# Patient Record
Sex: Male | Born: 1985 | Race: Black or African American | Hispanic: No | Marital: Single | Smoking: Current some day smoker
Health system: Southern US, Community
[De-identification: ages and names within clinical notes are randomized; demographics above are authoritative.]

---

## 2006-11-09 ENCOUNTER — Emergency Department (HOSPITAL_COMMUNITY): Admission: EM | Admit: 2006-11-09 | Discharge: 2006-11-09 | Payer: Self-pay | Admitting: Family Medicine

## 2015-06-06 ENCOUNTER — Encounter (HOSPITAL_COMMUNITY): Payer: Self-pay

## 2015-06-06 ENCOUNTER — Emergency Department (HOSPITAL_COMMUNITY)
Admission: EM | Admit: 2015-06-06 | Discharge: 2015-06-06 | Disposition: A | Discharge status: Home / Self Care | Payer: Non-veteran care | Attending: Emergency Medicine | Admitting: Emergency Medicine

## 2015-06-06 ENCOUNTER — Emergency Department (HOSPITAL_COMMUNITY): Payer: Non-veteran care

## 2015-06-06 DIAGNOSIS — R0789 Other chest pain: Secondary | ICD-10-CM | POA: Insufficient documentation

## 2015-06-06 DIAGNOSIS — F17210 Nicotine dependence, cigarettes, uncomplicated: Secondary | ICD-10-CM | POA: Diagnosis not present

## 2015-06-06 DIAGNOSIS — R079 Chest pain, unspecified: Secondary | ICD-10-CM | POA: Diagnosis present

## 2015-06-06 LAB — BASIC METABOLIC PANEL
Anion gap: 9 (ref 5–15)
CO2: 25 mmol/L (ref 22–32)
CREATININE: 0.89 mg/dL (ref 0.61–1.24)
Calcium: 8.9 mg/dL (ref 8.9–10.3)
Chloride: 104 mmol/L (ref 101–111)
GLUCOSE: 113 mg/dL — AB (ref 65–99)
POTASSIUM: 4 mmol/L (ref 3.5–5.1)
SODIUM: 138 mmol/L (ref 135–145)

## 2015-06-06 LAB — CBC
HEMATOCRIT: 43.8 % (ref 39.0–52.0)
Hemoglobin: 14.4 g/dL (ref 13.0–17.0)
MCH: 30.3 pg (ref 26.0–34.0)
MCHC: 32.9 g/dL (ref 30.0–36.0)
MCV: 92.2 fL (ref 78.0–100.0)
Platelets: 224 10*3/uL (ref 150–400)
RBC: 4.75 MIL/uL (ref 4.22–5.81)
RDW: 14.6 % (ref 11.5–15.5)
WBC: 7.3 10*3/uL (ref 4.0–10.5)

## 2015-06-06 LAB — I-STAT TROPONIN, ED: Troponin i, poc: 0 ng/mL (ref 0.00–0.08)

## 2015-06-06 MED ORDER — IBUPROFEN 600 MG PO TABS: 600.0000 mg | ORAL_TABLET | Freq: Four times a day (QID) | ORAL | Status: AC | PRN

## 2015-06-06 NOTE — ED Notes (Signed)
Pt reports onset last night constant sharp right sided chest pain, made worse with taking deep breaths.  No nausea, diaphoresis, cold/cough, injury or increased exercise.  Pt states feels short of breath at times.  NAD at triage.

## 2015-06-06 NOTE — ED Provider Notes (Signed)
CSN: [id number]     Arrival date & time 06/06/15  1333 History   First MD Initiated Contact with Patient 06/06/15 1638     Chief Complaint  Patient presents with  . Chest Pain   Patient is a 30 y.o. male presenting with chest pain. The history is provided by the patient. No language interpreter was used.  Chest Pain Pain location:  R chest Pain quality: aching and sharp   Pain radiates to:  Does not radiate Pain radiates to the back: no   Pain severity:  Moderate Onset quality:  Gradual Duration:  1 day Timing:  Intermittent Progression:  Unchanged Chronicity:  New Context: breathing   Relieved by:  None tried Worsened by:  Nothing tried Ineffective treatments:  None tried Associated symptoms: no abdominal pain, no altered mental status, no back pain, no cough, no dizziness, no dysphagia, no fever, no headache, no lower extremity edema, no nausea, no numbness, no palpitations, no shortness of breath, not vomiting and no weakness   Risk factors: male sex   Risk factors: no coronary artery disease, no diabetes mellitus, no hypertension, no prior DVT/PE and no smoking     History reviewed. No pertinent past medical history. History reviewed. No pertinent past surgical history. History reviewed. No pertinent family history. Social History  Substance Use Topics  . Smoking status: Current Some Day Smoker    Types: Cigarettes  . Smokeless tobacco: None  . Alcohol Use: 1.2 oz/week    2 Cans of beer per week    Review of Systems  Constitutional: Negative for fever, chills, activity change and appetite change.  HENT: Negative for congestion, dental problem, ear pain, facial swelling, hearing loss, rhinorrhea, sneezing, sore throat, trouble swallowing and voice change.   Eyes: Negative for photophobia, pain, redness and visual disturbance.  Respiratory: Negative for apnea, cough, chest tightness, shortness of breath, wheezing and stridor.   Cardiovascular: Positive for chest pain.  Negative for palpitations and leg swelling.  Gastrointestinal: Negative for nausea, vomiting, abdominal pain, diarrhea, constipation, blood in stool and abdominal distention.  Endocrine: Negative for polydipsia and polyuria.  Genitourinary: Negative for frequency, hematuria, flank pain, decreased urine volume and difficulty urinating.  Musculoskeletal: Negative for back pain, joint swelling, gait problem, neck pain and neck stiffness.  Skin: Negative for rash and wound.  Allergic/Immunologic: Negative for immunocompromised state.  Neurological: Negative for dizziness, syncope, facial asymmetry, speech difficulty, weakness, light-headedness, numbness and headaches.  Hematological: Negative for adenopathy.  Psychiatric/Behavioral: Negative for suicidal ideas, behavioral problems, confusion, sleep disturbance and agitation. The patient is not nervous/anxious.   All other systems reviewed and are negative.     Allergies  Review of patient's allergies indicates no known allergies.  Home Medications   Prior to Admission medications   Medication Sig Start Date End Date Taking? Authorizing Provider  ranitidine (ZANTAC) 150 MG tablet Take 150 mg by mouth daily as needed (acid reflux).   Yes Historical Provider, MD  ibuprofen (ADVIL,MOTRIN) 600 MG tablet Take 1 tablet (600 mg total) by mouth every 6 (six) hours as needed. 06/06/15   Dan Humphreys, MD   BP 120/67 mmHg  Pulse 60  Temp(Src) 98.7 F (37.1 C) (Oral)  Resp 18  Wt 112.946 kg  SpO2 99% Physical Exam  Constitutional: He is oriented to person, place, and time. He appears well-developed and well-nourished. No distress.  HENT:  Head: Normocephalic and atraumatic.  Right Ear: External ear normal.  Left Ear: External ear normal.  Eyes: Pupils are  equal, round, and reactive to light. Right eye exhibits no discharge. Left eye exhibits no discharge.  Neck: Normal range of motion. No JVD present. No tracheal deviation present.   Cardiovascular: Normal rate, regular rhythm and normal heart sounds.  Exam reveals no friction rub.   No murmur heard. Pulmonary/Chest: Effort normal and breath sounds normal. No stridor. No respiratory distress. He has no wheezes.  Abdominal: Soft. Bowel sounds are normal. He exhibits no distension. There is no rebound and no guarding.  Musculoskeletal: Normal range of motion. He exhibits no edema or tenderness.  Lymphadenopathy:    He has no cervical adenopathy.  Neurological: He is alert and oriented to person, place, and time. No cranial nerve deficit. Coordination normal.  Skin: Skin is warm and dry. No rash noted. No pallor.  Psychiatric: He has a normal mood and affect. His behavior is normal. Judgment and thought content normal.  Nursing note and vitals reviewed.   ED Course  Procedures (including critical care time) Labs Review Labs Reviewed  BASIC METABOLIC PANEL - Abnormal; Notable for the following:    Glucose, Bld 113 (*)    BUN <5 (*)    All other components within normal limits  CBC  I-STAT TROPOININ, ED    Imaging Review Dg Chest 2 View  06/06/2015  CLINICAL DATA:  Stabbing right-sided chest pain began yesterday and continues today worse with deep inspiration EXAM: CHEST  2 VIEW COMPARISON:  None. FINDINGS: The heart size and mediastinal contours are within normal limits. Both lungs are clear. The visualized skeletal structures are unremarkable. IMPRESSION: No active cardiopulmonary disease. Electronically Signed   By: Esperanza Heiraymond  Rubner M.D.   On: 06/06/2015 14:08   I have personally reviewed and evaluated these images and lab results as part of my medical decision-making.   EKG Interpretation None      MDM   Final diagnoses:  Musculoskeletal chest pain    Patient with 1 day of right-sided chest pain, pleuritic in nature. Patient denies prior history of this pain. Do not take any medications at home prior to arrival.  Vital signs normal. EKG with no ischemic  changes. Chest x-ray with no acute findings. Troponin negative, BMP, CBC unremarkable.  Patient with a normal physical exam. No friction rub on exam. No rash noted the body.  Patient PERC negative, do not suspect PE. Aspect ACS. Pain likely muscular skeletal in nature versus costochondritis.  Patient encouraged to use ibuprofen or Tylenol at home for symptomatically relief and use heating pads at home.  Patient ambulatory in no acute distress at time of discharge. [facility] with primary care physician  I discussed case with my attending, Dr. Judd Lienelo.      Dan HumphreysMichael Chantilly Linskey, MD 06/06/15 1723  Geoffery Lyonsouglas Delo, MD 06/06/15 [phone]

## 2015-06-06 NOTE — Discharge Instructions (Signed)

## 2017-04-14 IMAGING — DX DG CHEST 2V
2 series · 2 of 2 positions shown · non-contrast
Comparison: None.

CLINICAL DATA: Stabbing right-sided chest pain began yesterday and
continues today worse with deep inspiration

EXAM:
CHEST  2 VIEW

[chest pa]
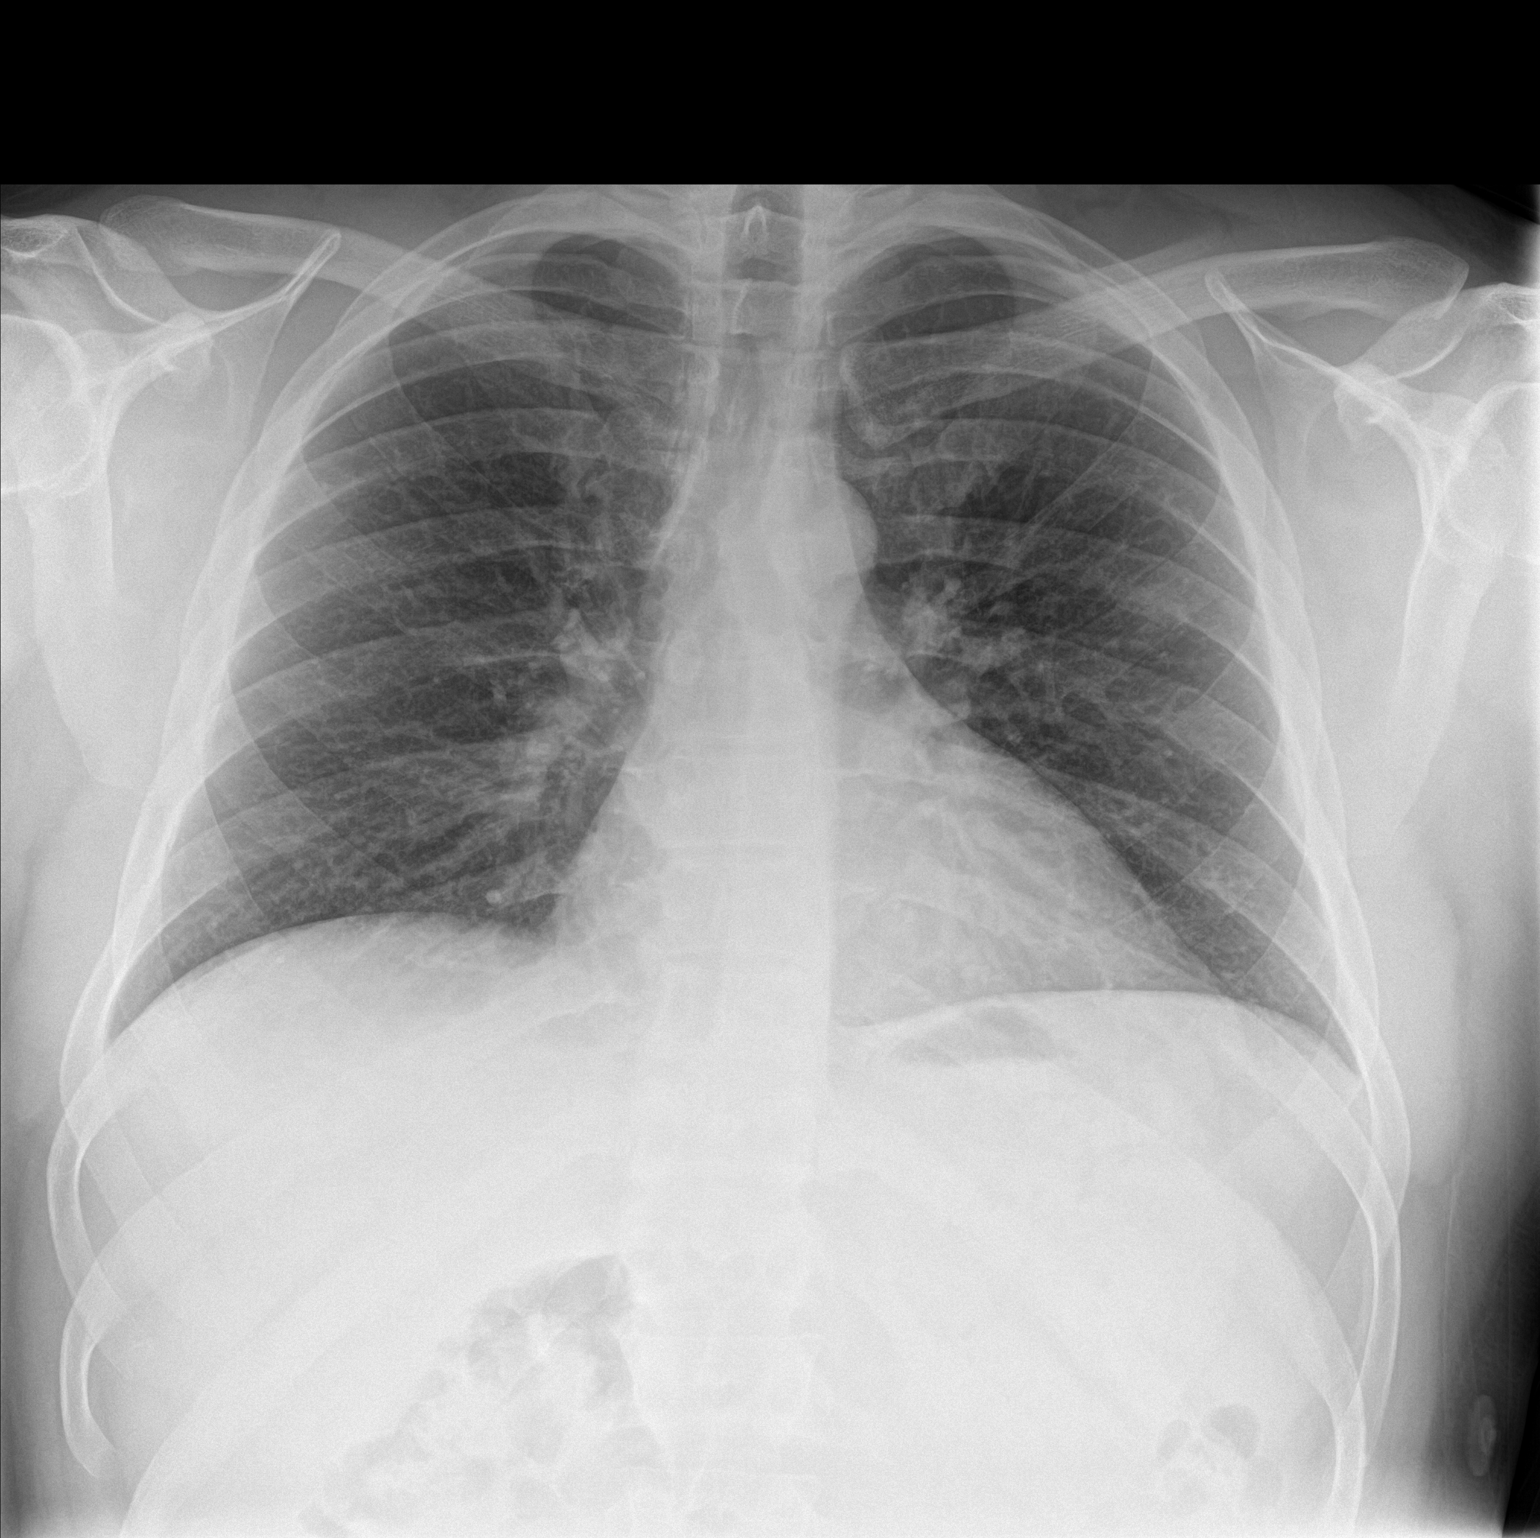

[chest lat]
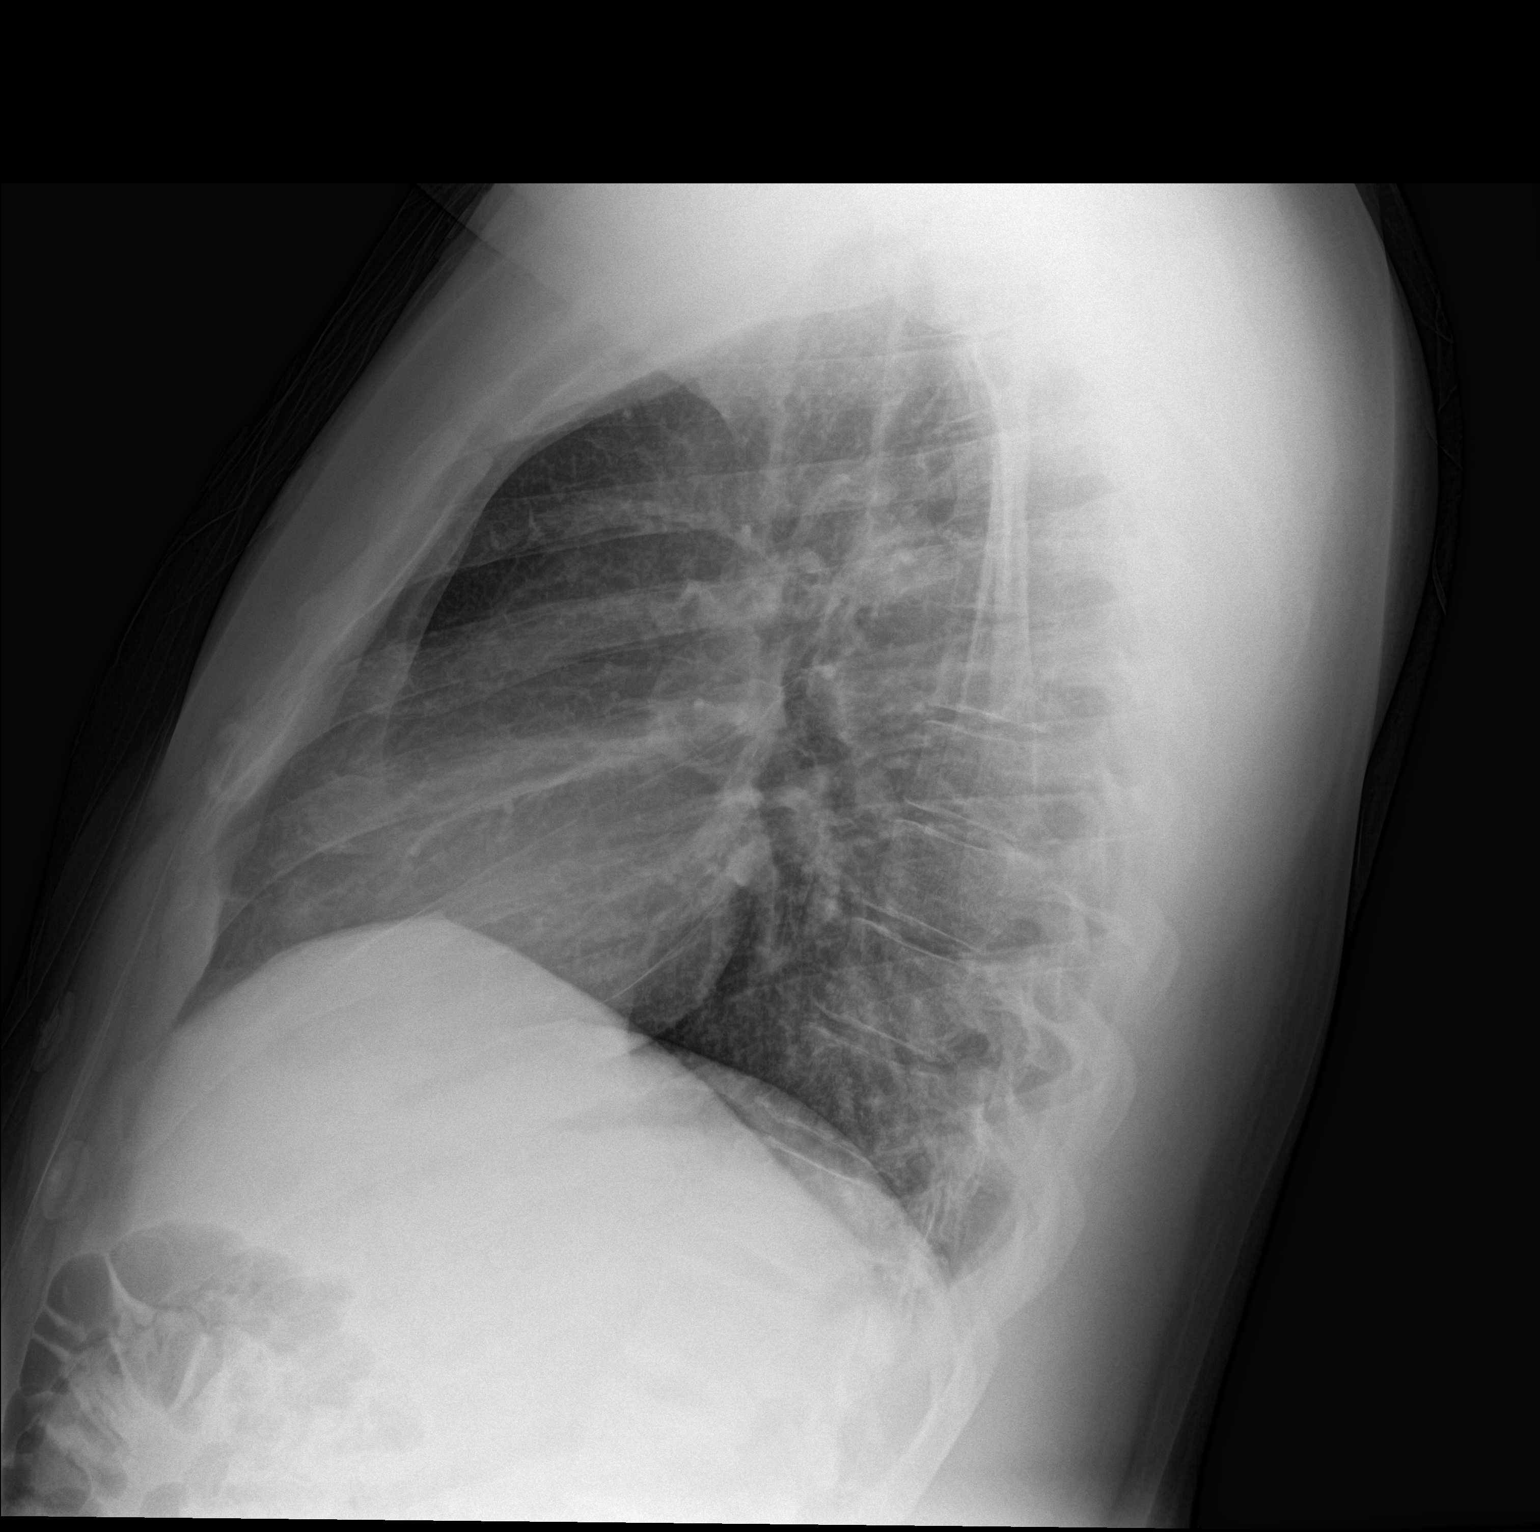

[2 of 2 positions shown; findings below may reference images not displayed]

FINDINGS: The heart size and mediastinal contours are within normal limits.
Both lungs are clear. The visualized skeletal structures are
unremarkable.
IMPRESSION: No active cardiopulmonary disease.

## 2017-11-17 ENCOUNTER — Emergency Department (HOSPITAL_COMMUNITY)
Admission: EM | Admit: 2017-11-17 | Discharge: 2017-11-17 | Disposition: A | Discharge status: Home / Self Care | Payer: Non-veteran care | Attending: Emergency Medicine | Admitting: Emergency Medicine

## 2017-11-17 ENCOUNTER — Encounter (HOSPITAL_COMMUNITY): Payer: Self-pay

## 2017-11-17 ENCOUNTER — Other Ambulatory Visit: Payer: Self-pay

## 2017-11-17 DIAGNOSIS — R239 Unspecified skin changes: Secondary | ICD-10-CM

## 2017-11-17 DIAGNOSIS — R222 Localized swelling, mass and lump, trunk: Secondary | ICD-10-CM | POA: Insufficient documentation

## 2017-11-17 DIAGNOSIS — M79671 Pain in right foot: Secondary | ICD-10-CM

## 2017-11-17 DIAGNOSIS — F17210 Nicotine dependence, cigarettes, uncomplicated: Secondary | ICD-10-CM | POA: Diagnosis not present

## 2017-11-17 DIAGNOSIS — M79672 Pain in left foot: Secondary | ICD-10-CM | POA: Insufficient documentation

## 2017-11-17 NOTE — ED Provider Notes (Signed)
[facility]-EMERGENCY DEPT Provider Note   CSN: [id number] Arrival date & time: 11/17/17  1307     History   Chief Complaint Chief Complaint  Patient presents with  . Abscess    HPI Harry Schaefer is a 32 y.o. male with no significant past medical history presents emergency department today for several complaints.  Patient reports that he recently got out of the [employer].  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  He reports that since applying a pair of [employer]'s his pain has been relieved.  He denies any trauma to the area.  No lower extremity swelling, open wounds, calf pain, fever, decreased range of motion, deformity, numbness/tingling/weakness.  He reports this is a chronic pain that is been ongoing for several years.  He has not been taking anything for it.  He believes this is due to "heel spurs".  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  He denies any overlying erythema, heat, fever, nausea/vomiting.  He notes these have been here for several years but has never been evaluated.  He denies any injury, trauma or foreign body introduction. He has not taken anything for it. Nothing makes them better or worse. They have not changed in size. There is no pain related to this. No recent weight loss. No numbness/tingling/weakness of extremities, difficulty swallowing or visual/hearing changes.   HPI  History reviewed. No pertinent past medical history.  There are no active problems to display for this patient.   History reviewed. No pertinent surgical history.      Home Medications    Prior to Admission medications   Medication Sig Start Date End Date Taking? Authorizing Provider  ibuprofen (ADVIL,MOTRIN) 600 MG tablet Take 1 tablet (600 mg total) by mouth every 6 (six) hours as needed. 06/06/15   Dan HumphreysIrick, Tilford Deaton, MD    ranitidine (ZANTAC) 150 MG tablet Take 150 mg by mouth daily as needed (acid reflux).    [provider]    Family History History reviewed. No pertinent family history.  Social History Social History   Tobacco Use  . Smoking status: Current Some Day Smoker    Types: Cigarettes  Substance Use Topics  . Alcohol use: Yes    Alcohol/week: 2.0 standard drinks    Types: 2 Cans of beer per week  . Drug use: No     Allergies   Patient has no known allergies.   Review of Systems Review of Systems  All other systems reviewed and are negative.    Physical Exam Updated Vital Signs BP 123/84 (BP Location: Right Arm)   Pulse 99   Temp 98.5 F (36.9 C) (Oral)   Resp 18   Ht 6\' 2"  (1.88 m)   Wt 131.5 kg   SpO2 96%   BMI 37.23 kg/m   Physical Exam  Constitutional: He appears well-developed and well-nourished.  HENT:  Head: Normocephalic and atraumatic.  Right Ear: External ear normal.  Left Ear: External ear normal.  Nose: Nose normal.  Mouth/Throat: Uvula is midline, oropharynx is clear and moist and mucous membranes are normal. No tonsillar exudate.  Eyes: Pupils are equal, round, and reactive to light. Right eye exhibits no discharge. Left eye exhibits no discharge. No scleral icterus.  Neck: Trachea normal. Neck supple. No spinous process tenderness present. No neck rigidity. Normal range  of motion present.    No nuchal rigidity or meningismus  Cardiovascular: Normal rate, regular rhythm and intact distal pulses.  No murmur heard. Pulses:      Radial pulses are 2+ on the right side, and 2+ on the left side.       Dorsalis pedis pulses are 2+ on the right side, and 2+ on the left side.       Posterior tibial pulses are 2+ on the right side, and 2+ on the left side.  No lower extremity swelling or edema. Calves symmetric in size bilaterally.  Pulmonary/Chest: Effort normal and breath sounds normal. He exhibits no tenderness.  Abdominal: Soft. Bowel sounds  are normal. There is no tenderness. There is no rebound and no guarding.  Musculoskeletal: He exhibits no edema.       Right ankle: Achilles tendon exhibits pain.       Left ankle: Achilles tendon exhibits pain.       Back:       Right lower leg: Normal.       Left lower leg: Normal.       Right foot: Normal.       Left foot: Normal.  Lower extremities with normal range of motion passively.  No calf tenderness.  Calves are equal in size bilaterally.  No edema.  Compartments are soft.  He is neurovascularly intact to the lower extremities.  No bony tenderness palpation.  There is pain at the base of the Achilles tendon bilaterally.  Normal Thompson test bilaterally.  No defect of the Achilles bilaterally.  No skin changes noted.  Lymphadenopathy:    He has no cervical adenopathy.  Neurological: He is alert.  Speech clear. Follows commands. No facial droop. PERRLA. EOM grossly intact. CN III-XII grossly intact. Grossly moves all extremities 4 without ataxia. Able and appropriate strength for age to upper and lower extremities bilaterally  Skin: Skin is warm and dry. Capillary refill takes less than 2 seconds. No rash noted. He is not diaphoretic. No erythema.  Psychiatric: He has a normal mood and affect.  Nursing note and vitals reviewed.   ED Treatments / Results  Labs (all labs ordered are listed, but only abnormal results are displayed) Labs Reviewed - No data to display  EKG None  Radiology No results found.  Procedures Procedures (including critical care time) EMERGENCY DEPARTMENT US SOFT TISSUE INTERPRETATION "Study: Limited Soft Tissue Ultrasound"  INDICATIONS: Nodule under skin Multiple views of the body part were obtained in real-time with a multi-frequency linear probe  PERFORMED BY: Myself IMAGES ARCHIVED?: No SIDE:Left and Midline BODY PART:Chest wall and Lower back INTERPRETATION:  No abcess noted, No cellulitis noted and Normal soft tissue  ultrasound    Medications Ordered in ED Medications - No data to display   Initial Impression / Assessment and Plan / ED Course  I have reviewed the triage vital signs and the nursing notes.  Pertinent labs & imaging results that were available during my care of the patient were reviewed by me and considered in my medical decision making (see chart for details).     32 y.o. male presenting with multiple complaints.   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  He notes this is chronic.  It is improved since buying a pair of [employer]'s.  I have a low suspicion for DVT based on exam and reported symptoms.  Patient with tenderness at the base  of his Achilles bilaterally.  There is no evidence of tendon rupture.  There is no calf tenderness.  He is neurovascular intact and compartments are soft.  He was offered an x-ray.  He denies at this time.  Will have him follow-up with podiatry. Conservative therapies recommended.  Patient with a noted lipoma of the right neck.  There is no evidence of abscess, rash, overlying infection.  This does not appear to be causing nerve impingement and he denies any neurologic symptoms.  Will have follow-up with [place] surgery as needed.  Handouts given.  Patient with nodules under the skin that has been there for several years not changed in size.  He denies any systemic symptoms with this.  No pain related to this.  Is noted to be a pea-sized nodule underneath the skin that is mobile.  There is no overlying skin changes.  No evidence of cellulitis or abscess.  Bedside ultrasound done with patient and noted no abnormalities.  No indication for emergent CT.  Will give information for patient to establish care with a PCP.  Recommended he may need to follow with a dermatologist.  The evaluation does not show pathology that would require ongoing emergent intervention or inpatient treatment. I  advised the patient to follow-up with PCP, dermatology, podiatry and [facility] this week. I advised the patient to return to the emergency department with new or worsening symptoms or new concerns. Specific return precautions discussed. The patient verbalized understanding and agreement with plan. All questions answered. No further questions at this time. The patient is hemodynamically stable, mentating appropriately and appears safe for discharge.  Final Clinical Impressions(s) / ED Diagnoses   Final diagnoses:  Skin change  Pain of both heels    ED Discharge Orders    None       Princella Pellegrini 11/17/17 1544    Wynetta Fines, MD 11/18/17 [phone]

## 2017-11-17 NOTE — Discharge Instructions (Signed)
Please call and make appropriate follow-up. Is very important that you establish care with a primary care provider to help manage these issues. I referred you to a PCP office that takes people even without insurance.  Please otherwise use the phone number listed in the back of this packet to find a primary care provider. Follow-up with podiatry for heel pain.  You were offered an x-ray today. Take Tylenol and Ibuprofen as needed for pain.  Follow up with dermatology in regards to your skin changes. See list below to call and find a dermatologist in the area.  If you develop worsening or new concerning symptoms you can return to the emergency department for re-evaluation.   [facility]:   [facility]  3.2 [phone])  Dermatologist  [address] [place]  [phone]   Dr. Mertha FindersJohn H. Hall Jr, MD  2.6 [phone])  Dermatologist  [address]  [phone]  [facility]  3.5 (3)  [facility]  [address]  [phone]   [facility]  4.0 (4)  Dermatologist  [address]  [phone]  Janalyn Harderafeen Stuart MD  3.0 (2)  Dermatologist  [address]  [phone]  Hoyle SauerMccoy Bruce  2.7 (6)  Dermatologist  [address]  [phone]  [place] Amy Y MD  2.0 (1)  Dermatologist  [address]  [phone]  [facility]  5.0 (3)  Doctor  [address]  [phone]

## 2017-11-17 NOTE — ED Triage Notes (Signed)
Pt reports lumps on neck, head, under arm, and back. Lumps noted but no drainage from any sites. Pt reports "heel spurs are acting up".

## 2021-12-27 ENCOUNTER — Emergency Department (HOSPITAL_COMMUNITY): Payer: No Typology Code available for payment source

## 2021-12-27 ENCOUNTER — Encounter (HOSPITAL_COMMUNITY): Payer: Self-pay

## 2021-12-27 ENCOUNTER — Emergency Department (HOSPITAL_COMMUNITY)
Admission: EM | Admit: 2021-12-27 | Discharge: 2021-12-27 | Disposition: A | Discharge status: Home / Self Care | Payer: No Typology Code available for payment source | Attending: Emergency Medicine | Admitting: Emergency Medicine

## 2021-12-27 DIAGNOSIS — M5441 Lumbago with sciatica, right side: Secondary | ICD-10-CM

## 2021-12-27 DIAGNOSIS — M545 Low back pain, unspecified: Secondary | ICD-10-CM | POA: Diagnosis present

## 2021-12-27 MED ORDER — METHOCARBAMOL 500 MG PO TABS: 500.0000 mg | ORAL_TABLET | Freq: Two times a day (BID) | ORAL | 0 refills | Status: AC

## 2021-12-27 MED ORDER — PREDNISONE 20 MG PO TABS
60.0000 mg | ORAL_TABLET | Freq: Once | ORAL | Status: AC
  Administered 2021-12-27: 60 mg via ORAL
  Filled 2021-12-27: qty 3

## 2021-12-27 MED ORDER — LIDOCAINE 5 % EX PTCH: 1.0000 | MEDICATED_PATCH | CUTANEOUS | 0 refills | Status: AC

## 2021-12-27 MED ORDER — METHOCARBAMOL 500 MG PO TABS
500.0000 mg | ORAL_TABLET | Freq: Once | ORAL | Status: AC
  Administered 2021-12-27: 500 mg via ORAL
  Filled 2021-12-27: qty 1

## 2021-12-27 MED ORDER — OXYCODONE-ACETAMINOPHEN 5-325 MG PO TABS
1.0000 | ORAL_TABLET | Freq: Once | ORAL | Status: AC
  Administered 2021-12-27: 1 via ORAL
  Filled 2021-12-27: qty 1

## 2021-12-27 MED ORDER — OXYCODONE HCL 5 MG PO TABS: 5.0000 mg | ORAL_TABLET | ORAL | 0 refills | Status: AC | PRN

## 2021-12-27 MED ORDER — PREDNISONE 20 MG PO TABS: 20.0000 mg | ORAL_TABLET | Freq: Every day | ORAL | 0 refills | Status: AC

## 2021-12-27 NOTE — Discharge Instructions (Addendum)
Take the mediations as prescribed.  Follow up with Primary care provider if symptoms do not improve  Use caution when taking these medications as one is a narcotic medication does have the potential for addiction.  Do not drive or operate heavy machinery while taking this medication.

## 2021-12-27 NOTE — ED Provider Triage Note (Signed)
Emergency Medicine Provider Triage Evaluation Note  Nickalaus Crooke , a 36 y.o. male  was evaluated in triage.  Pt complains of right-sided low back pain that radiates to right buttocks.  No injury.  Denies saddle paresthesias, bowel/bladder incontinence, lower extremity numbness/tingling, lower extremity weakness.  No IV drug use.  No fever or chills.    Review of Systems  Positive: Back pain Negative: numbness  Physical Exam  BP 120/77 (BP Location: Left Arm)   Pulse 76   Temp 98 F (36.7 C) (Oral)   Resp 16   SpO2 98%  Gen:   Awake, no distress   Resp:  Normal effort  MSK:   Moves extremities without difficulty  Other:    Medical Decision Making  Medically screening exam initiated at 10:53 AM.  Appropriate orders placed.  Beren Yniguez was informed that the remainder of the evaluation will be completed by another provider, this initial triage assessment does not replace that evaluation, and the importance of remaining in the ED until their evaluation is complete.  Zenaida Deed, PA-C 12/27/21 1054

## 2021-12-27 NOTE — ED Triage Notes (Signed)
Pt arrived via POV, c/o right sided back pain, down into leg. Denies any trauma to area, believes it is sciatica.

## 2021-12-27 NOTE — ED Provider Notes (Signed)
[facility] DEPT Provider Note   CSN: [id number] Arrival date & time: 12/27/21  0944    History  Chief Complaint  Patient presents with   Back Pain    Harry Schaefer is a 36 y.o. male here for evaluation of low back pain, radiates into right leg. No recent falls or injuries. No numbness, weakness, leg swelling, redness, warmth. No fever, hx of IVDU, bowel or bladder incontinence saddle anesthesia. Ambulatory here. No CP, SOB, abd pain, flank pain, urinary symptoms. No meds PTA.  HPI     Home Medications Prior to Admission medications   Medication Sig Start Date End Date Taking? Authorizing Provider  lidocaine (LIDODERM) 5 % Place 1 patch onto the skin daily. Remove & Discard patch within 12 hours or as directed by MD 12/27/21  Yes Lakeyn Dokken A, PA-C  methocarbamol (ROBAXIN) 500 MG tablet Take 1 tablet (500 mg total) by mouth 2 (two) times daily. 12/27/21  Yes Lyzbeth Genrich A, PA-C  oxyCODONE (ROXICODONE) 5 MG immediate release tablet Take 1 tablet (5 mg total) by mouth every 4 (four) hours as needed for severe pain. 12/27/21  Yes Ariah Mower A, PA-C  predniSONE (DELTASONE) 20 MG tablet Take 1 tablet (20 mg total) by mouth daily for 4 days. 12/27/21 12/31/21 Yes Jaquise Faux A, PA-C  ibuprofen (ADVIL,MOTRIN) 600 MG tablet Take 1 tablet (600 mg total) by mouth every 6 (six) hours as needed. 06/06/15   Vira Blanco, MD  ranitidine (ZANTAC) 150 MG tablet Take 150 mg by mouth daily as needed (acid reflux).    [provider]      Allergies    Patient has no known allergies.    Review of Systems   Review of Systems  Constitutional: Negative.   HENT: Negative.  Negative for congestion.   Respiratory: Negative.    Cardiovascular: Negative.   Gastrointestinal: Negative.   Genitourinary: Negative.   Musculoskeletal:  Positive for back pain. Negative for arthralgias, gait problem, joint swelling, myalgias, neck pain and neck stiffness.   Skin: Negative.   Neurological: Negative.   All other systems reviewed and are negative.  Physical Exam Updated Vital Signs BP 120/77 (BP Location: Left Arm)   Pulse 76   Temp 98 F (36.7 C) (Oral)   Resp 16   SpO2 98%  Physical Exam Physical Exam  Constitutional: Pt appears well-developed and well-nourished. No distress.  HENT:  Head: Normocephalic and atraumatic.  Mouth/Throat: Oropharynx is clear and moist. No oropharyngeal exudate.  Eyes: Conjunctivae are normal.  Neck: Normal range of motion. Neck supple.  Full ROM without pain  Cardiovascular: Normal rate, regular rhythm and intact distal pulses.   Pulmonary/Chest: Effort normal and breath sounds normal. No respiratory distress. Pt has no wheezes.  Abdominal: Soft. Pt exhibits no distension. There is no tenderness, rebound or guarding. No abd bruit or pulsatile mass Musculoskeletal:  Full range of motion of the T-spine and L-spine with flexion, hyperextension, and lateral flexion. No midline tenderness or stepoffs. No tenderness to palpation of the spinous processes of the T-spine or L-spine. Mild tenderness to palpation of the paraspinous muscles of the L-spine. Positive straight leg raise. No shortening or rogation of legs. No tender femur, tib/fib, knee, feet BIL Lymphadenopathy:    Pt has no cervical adenopathy.  Neurological: Pt is alert. Pt has normal reflexes.  Speech is clear and goal oriented, follows commands Equal strength BIL Sensation normal to light and sharp touch Moves extremities without ataxia, coordination intact Normal  gait Normal balance Skin: Skin is warm and dry. No rash noted or lesions noted. Pt is not diaphoretic. No erythema, ecchymosis,edema or warmth.  Psychiatric: Pt has a normal mood and affect. Behavior is normal.  Nursing note and vitals reviewed.  ED Results / Procedures / Treatments   Labs (all labs ordered are listed, but only abnormal results are displayed) Labs Reviewed - No  data to display  EKG None  Radiology DG Lumbar Spine Complete  Result Date: 12/27/2021 CLINICAL DATA:  Pain across lower back.  No injury. EXAM: LUMBAR SPINE - COMPLETE 4+ VIEW COMPARISON:  None Available. FINDINGS: There is no evidence of lumbar spine fracture. Alignment is normal. Intervertebral disc spaces are maintained. Mild L5-S1 posterior facet arthropathy. IMPRESSION: 1. Mild L5-S1 posterior facet arthropathy. 2. Otherwise normal appearance of the lumbar spine. Electronically Signed   By: Ted Mcalpine M.D.   On: 12/27/2021 11:48    Procedures Procedures    Medications Ordered in ED Medications  predniSONE (DELTASONE) tablet 60 mg (60 mg Oral Given 12/27/21 1358)  methocarbamol (ROBAXIN) tablet 500 mg (500 mg Oral Given by Other 12/27/21 1358)  oxyCODONE-acetaminophen (PERCOCET/ROXICET) 5-325 MG per tablet 1 tablet (1 tablet Oral Given 12/27/21 1357)    ED Course/ Medical Decision Making/ A&P    36 year old here for evaluation of atraumatic back pain over the last few days.  Starts at his right lower back into his right glut into the lateral aspect of his right thigh.  Does not extend into his feet or toes.  No fever, history of IV drug use, bowel or bladder incontinence, saddle paresthesia.  No chest pain, abdominal pain, shortness of breath.  Low suspicion for dissection, VTE as cause of his leg pain.  No numbness or weakness.  He is ambulatory here.  Imaging personally viewed and interpreted X-ray lumbar withMild L5-S1 posterior facet arthropathy  Discussed results with patient, family in room.  Will treat symptomatically.  Urged follow outpatient with orthopedics, neurosurgery symptoms do not improve.  At this time of low suspicion for acute neurosurgical emergency such as cauda equina, discitis, osteomyelitis, transverse myelitis, psoas abscess, occult fracture.  Does have prediabetes.  I discussed first benefit of steroids.  We will keep close monitoring on his symptoms.   Return for new or worsening symptoms.  The patient has been appropriately medically screened and/or stabilized in the ED. I have low suspicion for any other emergent medical condition which would require further screening, evaluation or treatment in the ED or require inpatient management.  Patient is hemodynamically stable and in no acute distress.  Patient able to ambulate in department prior to ED.  Evaluation does not show acute pathology that would require ongoing or additional emergent interventions while in the emergency department or further inpatient treatment.  I have discussed the diagnosis with the patient and answered all questions.  Pain is been managed while in the emergency department and patient has no further complaints prior to discharge.  Patient is comfortable with plan discussed in room and is stable for discharge at this time.  I have discussed strict return precautions for returning to the emergency department.  Patient was encouraged to follow-up with PCP/specialist refer to at discharge.                           Medical Decision Making Amount and/or Complexity of Data Reviewed Independent Historian: friend External Data Reviewed: radiology and notes. Radiology: ordered and independent interpretation  performed. Decision-making details documented in ED Course.  Risk OTC drugs. Prescription drug management. Decision regarding hospitalization. Diagnosis or treatment significantly limited by social determinants of health.          Final Clinical Impression(s) / ED Diagnoses Final diagnoses:  Acute right-sided low back pain with right-sided sciatica    Rx / DC Orders ED Discharge Orders          Ordered    oxyCODONE (ROXICODONE) 5 MG immediate release tablet  Every 4 hours PRN        12/27/21 1416    methocarbamol (ROBAXIN) 500 MG tablet  2 times daily        12/27/21 1416    predniSONE (DELTASONE) 20 MG tablet  Daily        12/27/21 1416    lidocaine  (LIDODERM) 5 %  Every 24 hours        12/27/21 1416              Iness Pangilinan A, PA-C 12/27/21 1417    Rancour, Jeannett Senior, MD 12/27/21 1427
